# Patient Record
Sex: Male | Born: 2000
Health system: Southern US, Community
[De-identification: ages and names within clinical notes are randomized; demographics above are authoritative.]

## PROBLEM LIST (undated history)

## (undated) DIAGNOSIS — K219 Gastro-esophageal reflux disease without esophagitis: Secondary | ICD-10-CM

## (undated) DIAGNOSIS — F988 Other specified behavioral and emotional disorders with onset usually occurring in childhood and adolescence: Secondary | ICD-10-CM

## (undated) DIAGNOSIS — F32A Depression, unspecified: Secondary | ICD-10-CM

## (undated) DIAGNOSIS — F329 Major depressive disorder, single episode, unspecified: Secondary | ICD-10-CM

## (undated) DIAGNOSIS — F419 Anxiety disorder, unspecified: Secondary | ICD-10-CM

---
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
## 2021-08-03 ENCOUNTER — Encounter: Payer: Self-pay | Admitting: Physical Therapy

## 2021-08-03 ENCOUNTER — Ambulatory Visit: Payer: BC Managed Care – PPO | Attending: Orthopedic Surgery | Admitting: Physical Therapy

## 2021-08-03 DIAGNOSIS — M6281 Muscle weakness (generalized): Secondary | ICD-10-CM | POA: Diagnosis present

## 2021-08-03 DIAGNOSIS — M25611 Stiffness of right shoulder, not elsewhere classified: Secondary | ICD-10-CM | POA: Diagnosis present

## 2021-08-03 DIAGNOSIS — M25511 Pain in right shoulder: Secondary | ICD-10-CM | POA: Insufficient documentation

## 2021-08-03 NOTE — Therapy (Signed)
OUTPATIENT PHYSICAL THERAPY SHOULDER EVALUATION   Patient Name: Adam Hutchinson MRN: [id number] DOB:June 25, 2000, 21 y.o., male Today's Date: 08/03/2021   PT End of Session - 08/03/21 1616     Visit Number 1    Date for PT Re-Evaluation 11/03/21    Authorization Type BCBS    PT Start Time 1613    PT Stop Time 1700    PT Time Calculation (min) 47 min    Activity Tolerance Patient tolerated treatment well    Behavior During Therapy WFL for tasks assessed/performed             Past Medical History:  Diagnosis Date   ADD (attention deficit disorder)    Anxiety    Depression    GERD (gastroesophageal reflux disease)    History reviewed. No pertinent surgical history. Patient Active Problem List   Diagnosis Date Noted   Puberty delay 07/11/2017   Neutropenia ([place]) 07/11/2017   Elevated liver function tests 07/11/2017   Overweight 07/11/2017   Goiter 07/11/2017   Bipolar I disorder ([place]) 06/15/2013   Bipolar affective disorder, mixed, severe ([place]) 12/28/2011   ADHD (attention deficit hyperactivity disorder), combined type 12/28/2011   GAD (generalized anxiety disorder) 12/28/2011   ODD (oppositional defiant disorder) 11/19/2011   Aggressive behavior 11/19/2011   Auditory processing disorder 11/19/2011   Hyperacusis 11/19/2011   GERD (gastroesophageal reflux disease) 11/19/2011    PCP: Gearldine Bienenstock  REFERRING PROVIDER: Sherre Poot  REFERRING DIAG: S/P SLAP repair  THERAPY DIAG:  Acute pain of right shoulder  Stiffness of right shoulder, not elsewhere classified  Muscle weakness (generalized)  Rationale for Evaluation and Treatment Rehabilitation  ONSET DATE: 05/25/21  SUBJECTIVE:                                                                                                                                                                                      SUBJECTIVE STATEMENT: Patient had right SLAP repair 05/25/21, he has been going to PT at [place] where he goes  to school.  He is on break and is wanting to continue PT her in his hometown.  His last PT session was 2 weeks, building up in the protocol and getting a little sore  PERTINENT HISTORY: Pt is a 21 yo male who sustained a R [place] joint dislocation after falling out of a loft bed 09/13/2020. Went to urgent care and was x-ray diagnosed with a dislocation. Was referred to ER and reduced at the ED. Was discharged and followed up with [facility]. Was instructed to monitor symptoms and to follow-up if he had future problems. He again dislocated on 01/19/21 and went to the ED at  [facility]. Dislocation was reduced and referred back to [facility]. Saw Dr. Nicoletta Dress and had MRI which confirmed R SLAP lesion, Hill-sachs deformity. He has been scheduled for surgery on May 8th   PAIN:  Are you having pain? 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, has access to gym  PLOF: Independent was exercising with eights in gym about twice a wekk  PATIENT GOALS have normal ROM and strength  OBJECTIVE:  PATIENT SURVEYS:  FOTO 56  POSTURE: Significantly rounded shoulders some scapular winging  UPPER EXTREMITY ROM:   Active PROM ROM Right AROM eval Right PROM eval  Shoulder flexion 155 170  Shoulder extension    Shoulder abduction 165 175  Shoulder adduction    Shoulder internal rotation 45 50  Shoulder external rotation 90 90  Elbow flexion    Elbow extension    Wrist flexion    Wrist extension    Wrist ulnar deviation    Wrist  radial deviation    Wrist pronation    Wrist supination    (Blank rows = not tested)  UPPER EXTREMITY MMT:  MMT Right eval Left eval  Shoulder flexion 4   Shoulder extension 4+   Shoulder abduction 4-   Shoulder adduction    Shoulder internal rotation 4   Shoulder external rotation 4-   Middle trapezius    Lower trapezius    Elbow flexion    Elbow extension    Wrist flexion    Wrist extension    Wrist ulnar deviation    Wrist radial deviation    Wrist pronation    Wrist supination    Grip strength (lbs)    (Blank rows = not tested) PALPATION:  Not sore, non tender, has scapular winging   TODAY'S TREATMENT:  UBE Level 4 x 6 minutes (7fwd/3bkwd) Rows 15# working on form and posture and scapular retraction 3x10 Lats 15# slow and controlled verbal and tactile cues for form and the scapular retraction    PATIENT EDUCATION: Education details: continue the one from [employer] Person educated: Patient and Parent Education method: Explanation Education comprehension: verbalized understanding   HOME EXERCISE PROGRAM:   ASSESSMENT:  CLINICAL IMPRESSION: Patient is a 21 y.o. male who was seen today for physical therapy evaluation and treatment for PT s/p SLAP repair 5/8/23He was having PT at [facility] but is back in [place] for the summer.  He has good ROM and good strength, some limitations at end range.  Biggest issue is his posture, severe rounded shoulders, very weak periscapular mms.  We have protocol in our accordian file alphabetized.    OBJECTIVE IMPAIRMENTS decreased endurance, decreased ROM, decreased strength, increased muscle spasms, impaired UE functional use, improper body mechanics, postural dysfunction, and pain.   REHAB POTENTIAL: Good  CLINICAL DECISION MAKING: Stable/uncomplicated  EVALUATION COMPLEXITY: Low   GOALS: Goals reviewed with patient? Yes  SHORT TERM GOALS: Target date: 08/17/21  Independent with HEP Goal status: MET  LONG TERM GOALS:  Target date:10/26/21 Hold proper posture for > 5 minutes Goal status: IN PROGRESS  2.  Understand safety in  gym and advanced HEP Goal status: INITIAL  3.  Increase AROM to WNL's Goal status: IN PROGRESS  4.  Increase strength to 5/5 Goal status: IN PROGRESS   PLAN: PT FREQUENCY: 1-2x/week  PT DURATION: 10 weeks  PLANNED INTERVENTIONS: Therapeutic exercises, Therapeutic activity, Neuromuscular re-education, Balance training, Gait training, Patient/Family education, Self Care, Joint mobilization, Vasopneumatic device, Manual therapy, and Re-evaluation  PLAN FOR NEXT SESSION: follow protocol   Sumner Boast, PT 08/03/2021, 4:17 PM

## 2021-08-06 ENCOUNTER — Ambulatory Visit: Payer: BC Managed Care – PPO | Admitting: Physical Therapy

## 2021-08-06 NOTE — Addendum Note (Signed)
Addended by: Jearld Lesch on: 08/06/2021 10:26 AM   Modules accepted: Orders

## 2021-08-10 ENCOUNTER — Encounter: Payer: Self-pay | Admitting: Physical Therapy

## 2021-08-10 ENCOUNTER — Ambulatory Visit: Payer: BC Managed Care – PPO | Admitting: Physical Therapy

## 2021-08-10 DIAGNOSIS — M25511 Pain in right shoulder: Secondary | ICD-10-CM | POA: Diagnosis not present

## 2021-08-10 DIAGNOSIS — M25611 Stiffness of right shoulder, not elsewhere classified: Secondary | ICD-10-CM

## 2021-08-10 DIAGNOSIS — M6281 Muscle weakness (generalized): Secondary | ICD-10-CM

## 2021-08-10 NOTE — Therapy (Signed)
OUTPATIENT PHYSICAL THERAPY SHOULDER EVALUATION   Patient Name: Adam Hutchinson MRN: [id number] DOB:[birth year], 21 y.o., male Today's Date: 08/10/2021   PT End of Session - 08/10/21 1523     Visit Number 2    Date for PT Re-Evaluation 11/03/21    Authorization Type BCBS    PT Start Time 1524    PT Stop Time 1612    PT Time Calculation (min) 48 min    Activity Tolerance Patient tolerated treatment well    Behavior During Therapy WFL for tasks assessed/performed             Past Medical History:  Diagnosis Date   ADD (attention deficit disorder)    Anxiety    Depression    GERD (gastroesophageal reflux disease)    History reviewed. No pertinent surgical history. Patient Active Problem List   Diagnosis Date Noted   Puberty delay 07/11/2017   Neutropenia ([place]) 07/11/2017   Elevated liver function tests 07/11/2017   Overweight 07/11/2017   Goiter 07/11/2017   Bipolar I disorder ([place]) 06/15/2013   Bipolar affective disorder, mixed, severe ([place]) 12/28/2011   ADHD (attention deficit hyperactivity disorder), combined type 12/28/2011   GAD (generalized anxiety disorder) 12/28/2011   ODD (oppositional defiant disorder) 11/19/2011   Aggressive behavior 11/19/2011   Auditory processing disorder 11/19/2011   Hyperacusis 11/19/2011   GERD (gastroesophageal reflux disease) 11/19/2011    PCP: Gearldine Bienenstock  REFERRING PROVIDER: Sherre Poot  REFERRING DIAG: S/P SLAP repair  THERAPY DIAG:  Acute pain of right shoulder  Stiffness of right shoulder, not elsewhere classified  Muscle weakness (generalized)  Rationale for Evaluation and Treatment Rehabilitation  ONSET DATE: 05/25/21  SUBJECTIVE:                                                                                                                                                                                      SUBJECTIVE STATEMENT: Patient had right SLAP repair 05/25/21, he has been going to PT at [place] where he goes  to school.  He is on break and is wanting to continue PT her in his hometown.  He reports no soreness  PERTINENT HISTORY: Pt is a 21 yo male who sustained a R [place] joint dislocation after falling out of a loft bed 09/13/2020. Went to urgent care and was x-ray diagnosed with a dislocation. Was referred to ER and reduced at the ED. Was discharged and followed up with [facility]. Was instructed to monitor symptoms and to follow-up if he had future problems. He again dislocated on 01/19/21 and went to the ED at [facility]. Dislocation was reduced and referred back to [facility] Saw Dr. Nicoletta Dress  and had MRI which confirmed R SLAP lesion, Hill-sachs deformity. He has been scheduled for surgery on May 8th   PAIN:  Are you having pain? 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, has access to gym  PLOF: Independent was exercising with eights in gym about twice a wekk  PATIENT GOALS have normal ROM and strength  OBJECTIVE:  PATIENT SURVEYS:  FOTO 56  POSTURE: Significantly rounded shoulders some scapular winging  UPPER EXTREMITY ROM:   Active PROM ROM Right AROM eval Right PROM eval  Shoulder flexion 155 170  Shoulder extension    Shoulder abduction 165 175  Shoulder adduction    Shoulder internal rotation 45 50  Shoulder external rotation 90 90  Elbow flexion    Elbow extension    Wrist flexion    Wrist extension    Wrist ulnar deviation    Wrist radial deviation    Wrist pronation    Wrist supination    (Blank  rows = not tested)  UPPER EXTREMITY MMT:  MMT Right eval Left eval  Shoulder flexion 4   Shoulder extension 4+   Shoulder abduction 4-   Shoulder adduction    Shoulder internal rotation 4   Shoulder external rotation 4-   Middle trapezius    Lower trapezius    Elbow flexion    Elbow extension    Wrist flexion    Wrist extension    Wrist ulnar deviation    Wrist radial deviation    Wrist pronation    Wrist supination    Grip strength (lbs)    (Blank rows = not tested) PALPATION:  Not sore, non tender, has scapular winging   TODAY'S TREATMENT:  UBE Level 5 x 6 minute  Seated rwo 35# 2x12 Lats 35# 2x12 15# serratus press 2x12 needed cues to get the form 35# triceps  2x12 Biceps 10# 2x12 green tband ER, horizontal abduction, abduction with band behind back, 45degrees Green tband IR, and half star with right arm 8# overhead carry 5# bent over row, 3# extension, 2# reverse flies Light ball thowing, 1# ball throwing 4.4# rhythmic stabilization, both hands and with the right only 4.4# ball around waist for IR    PATIENT EDUCATION: Education details: continue the one from [place] Person educated: Patient and Parent Education method: Explanation Education comprehension: verbalized understanding   HOME EXERCISE PROGRAM:   ASSESSMENT:  CLINICAL IMPRESSION: I added a lot of exercises and really tried to not cause popping or pain, he had one time that the shoulder popped with throwing but no pain and no issue after that with throwing or other exercises.Tolerated all without issues, some cues needed for posture and form   OBJECTIVE IMPAIRMENTS decreased endurance, decreased ROM, decreased strength, increased muscle spasms, impaired UE functional use, improper body mechanics, postural dysfunction, and pain.   REHAB POTENTIAL: Good  CLINICAL DECISION MAKING: Stable/uncomplicated  EVALUATION COMPLEXITY: Low   GOALS: Goals reviewed with patient? Yes  SHORT TERM  GOALS: Target date: 08/17/21  Independent with HEP Goal status: MET  LONG TERM GOALS: Target date:10/26/21 Hold  proper posture for > 5 minutes Goal status: IN PROGRESS  2.  Understand safety in gym and advanced HEP Goal status: INITIAL  3.  Increase AROM to WNL's Goal status: IN PROGRESS  4.  Increase strength to 5/5 Goal status: IN PROGRESS   PLAN: PT FREQUENCY: 1-2x/week  PT DURATION: 10 weeks  PLANNED INTERVENTIONS: Therapeutic exercises, Therapeutic activity, Neuromuscular re-education, Balance training, Gait training, Patient/Family education, Self Care, Joint mobilization, Vasopneumatic device, Manual therapy, and Re-evaluation  PLAN FOR NEXT SESSION: follow protocol continue to add as tolerated    Sumner Boast, PT 08/10/2021, 3:31 PM

## 2021-08-13 ENCOUNTER — Ambulatory Visit: Payer: BC Managed Care – PPO | Admitting: Physical Therapy

## 2021-08-13 ENCOUNTER — Encounter: Payer: Self-pay | Admitting: Physical Therapy

## 2021-08-13 DIAGNOSIS — M25511 Pain in right shoulder: Secondary | ICD-10-CM | POA: Diagnosis not present

## 2021-08-13 DIAGNOSIS — M6281 Muscle weakness (generalized): Secondary | ICD-10-CM

## 2021-08-13 DIAGNOSIS — M25611 Stiffness of right shoulder, not elsewhere classified: Secondary | ICD-10-CM

## 2021-08-13 NOTE — Therapy (Signed)
OUTPATIENT PHYSICAL THERAPY SHOULDER EVALUATION   Patient Name: Adam Hutchinson MRN: [id number] DOB:[birth year], 21 y.o., male Today's Date: 08/13/2021   PT End of Session - 08/13/21 1611     Visit Number 3    Date for PT Re-Evaluation 11/03/21    Authorization Type BCBS    PT Start Time 1610    PT Stop Time 1655    PT Time Calculation (min) 45 min    Activity Tolerance Patient tolerated treatment well    Behavior During Therapy WFL for tasks assessed/performed             Past Medical History:  Diagnosis Date   ADD (attention deficit disorder)    Anxiety    Depression    GERD (gastroesophageal reflux disease)    History reviewed. No pertinent surgical history. Patient Active Problem List   Diagnosis Date Noted   Puberty delay 07/11/2017   Neutropenia ([place]) 07/11/2017   Elevated liver function tests 07/11/2017   Overweight 07/11/2017   Goiter 07/11/2017   Bipolar I disorder ([place]) 06/15/2013   Bipolar affective disorder, mixed, severe ([place]) 12/28/2011   ADHD (attention deficit hyperactivity disorder), combined type 12/28/2011   GAD (generalized anxiety disorder) 12/28/2011   ODD (oppositional defiant disorder) 11/19/2011   Aggressive behavior 11/19/2011   Auditory processing disorder 11/19/2011   Hyperacusis 11/19/2011   GERD (gastroesophageal reflux disease) 11/19/2011    PCP: Gearldine Bienenstock  REFERRING PROVIDER: Sherre Poot  REFERRING DIAG: S/P SLAP repair  THERAPY DIAG:  Acute pain of right shoulder  Stiffness of right shoulder, not elsewhere classified  Muscle weakness (generalized)  Rationale for Evaluation and Treatment Rehabilitation  ONSET DATE: 05/25/21  SUBJECTIVE:                                                                                                                                                                                      SUBJECTIVE STATEMENT: PAtient reports that he was a little sore in the right shoulder after the last treatment,  he thought it may have been the overhead activity, reports soreness for about 24 hours PERTINENT HISTORY: Pt is a 21 yo male who sustained a R [place] joint dislocation after falling out of a loft bed 09/13/2020. Went to urgent care and was x-ray diagnosed with a dislocation. Was referred to ER and reduced at the ED. Was discharged and followed up with [facility]. Was instructed to monitor symptoms and to follow-up if he had future problems. He again dislocated on 01/19/21 and went to the ED at [facility]. Dislocation was reduced and referred back to [facility]. Saw Dr. Nicoletta Dress and had MRI which confirmed R SLAP lesion, Hill-sachs deformity.  He has been scheduled for surgery on May 8th   PAIN:  Are you having pain? 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, has access to gym  PLOF: Independent was exercising with eights in gym about twice a wekk  PATIENT GOALS have normal ROM and strength  OBJECTIVE:  PATIENT SURVEYS:  FOTO 56  POSTURE: Significantly rounded shoulders some scapular winging  UPPER EXTREMITY ROM:   Active PROM ROM Right AROM eval Right PROM eval  Shoulder flexion 155 170  Shoulder extension    Shoulder abduction 165 175  Shoulder adduction    Shoulder internal rotation 45 50  Shoulder external rotation 90 90  Elbow flexion    Elbow extension    Wrist flexion    Wrist extension    Wrist ulnar deviation    Wrist radial deviation    Wrist pronation    Wrist supination    (Blank rows = not  tested)  UPPER EXTREMITY MMT:  MMT Right eval Left eval  Shoulder flexion 4   Shoulder extension 4+   Shoulder abduction 4-   Shoulder adduction    Shoulder internal rotation 4   Shoulder external rotation 4-   Middle trapezius    Lower trapezius    Elbow flexion    Elbow extension    Wrist flexion    Wrist extension    Wrist ulnar deviation    Wrist radial deviation    Wrist pronation    Wrist supination    Grip strength (lbs)    (Blank rows = not tested) PALPATION:  Not sore, non tender, has scapular winging   TODAY'S TREATMENT:  UBE Level 4 x 6 minute  Seated rwo 35# 2x12 Lats 35# 2x12 15# serratus press 2x12 needed cues to get the form 10# bench press with limited ROM 35# triceps  2x12 Biceps 10# 2x12 Red tband ER, horizontal abduction, abduction with band behind back, 45degrees Red tband IR, and half star with right arm Light ball thowing, 1# ball throwing 4.4# rhythmic stabilization, both hands and with the right only 4.4# ball around waist for IR 10# straight arm pulls 20# farmer carry at side PNF 1# W Teacher, English as a foreign language vs wall 4 points   PATIENT EDUCATION: Education details: continue the one from [place] Person educated: Patient and Parent Education method: Explanation Education comprehension: verbalized understanding   HOME EXERCISE PROGRAM:   ASSESSMENT:  CLINICAL IMPRESSION: Patient had some soreness after the last time, I added a few things took away the overhead carry and took away the bent over rows as he thought this was the cause of some of the soreness.  He is doing well, had difficulty with the W backs   OBJECTIVE IMPAIRMENTS decreased endurance, decreased ROM, decreased strength, increased muscle spasms, impaired UE functional use, improper body mechanics, postural dysfunction, and pain.   REHAB POTENTIAL: Good  CLINICAL DECISION MAKING: Stable/uncomplicated  EVALUATION COMPLEXITY: Low   GOALS: Goals reviewed with patient?  Yes  SHORT TERM GOALS: Target date: 08/17/21  Independent with HEP Goal status: MET  LONG TERM GOALS: Target date:10/26/21  Hold proper posture for > 5 minutes Goal status: IN PROGRESS  2.  Understand safety in gym and advanced HEP Goal status: INITIAL  3.  Increase AROM to WNL's Goal status: IN PROGRESS  4.  Increase strength to 5/5 Goal status: IN PROGRESS   PLAN: PT FREQUENCY: 1-2x/week  PT DURATION: 10 weeks  PLANNED INTERVENTIONS: Therapeutic exercises, Therapeutic activity, Neuromuscular re-education, Balance training, Gait training, Patient/Family education, Self Care, Joint mobilization, Vasopneumatic device, Manual therapy, and Re-evaluation  PLAN FOR NEXT SESSION: follow protocol continue to add as tolerated    Sumner Boast, PT 08/13/2021, 4:11 PM

## 2021-08-17 ENCOUNTER — Ambulatory Visit: Payer: BC Managed Care – PPO | Admitting: Physical Therapy

## 2021-08-17 ENCOUNTER — Encounter: Payer: Self-pay | Admitting: Physical Therapy

## 2021-08-17 DIAGNOSIS — M25611 Stiffness of right shoulder, not elsewhere classified: Secondary | ICD-10-CM

## 2021-08-17 DIAGNOSIS — M25511 Pain in right shoulder: Secondary | ICD-10-CM | POA: Diagnosis not present

## 2021-08-17 DIAGNOSIS — M6281 Muscle weakness (generalized): Secondary | ICD-10-CM

## 2021-08-17 NOTE — Therapy (Signed)
OUTPATIENT PHYSICAL THERAPY SHOULDER EVALUATION   Patient Name: Adam Hutchinson MRN: [id number] DOB:Nov 15, 2000, 21 y.o., male Today's Date: 08/17/2021   PT End of Session - 08/17/21 1531     Visit Number 4    Date for PT Re-Evaluation 11/03/21    Authorization Type BCBS    PT Start Time 1527    PT Stop Time 1610    PT Time Calculation (min) 43 min    Activity Tolerance Patient tolerated treatment well    Behavior During Therapy WFL for tasks assessed/performed             Past Medical History:  Diagnosis Date   ADD (attention deficit disorder)    Anxiety    Depression    GERD (gastroesophageal reflux disease)    History reviewed. No pertinent surgical history. Patient Active Problem List   Diagnosis Date Noted   Puberty delay 07/11/2017   Neutropenia ([place]) 07/11/2017   Elevated liver function tests 07/11/2017   Overweight 07/11/2017   Goiter 07/11/2017   Bipolar I disorder ([place]) 06/15/2013   Bipolar affective disorder, mixed, severe ([place]) 12/28/2011   ADHD (attention deficit hyperactivity disorder), combined type 12/28/2011   GAD (generalized anxiety disorder) 12/28/2011   ODD (oppositional defiant disorder) 11/19/2011   Aggressive behavior 11/19/2011   Auditory processing disorder 11/19/2011   Hyperacusis 11/19/2011   GERD (gastroesophageal reflux disease) 11/19/2011    PCP: Gearldine Bienenstock  REFERRING PROVIDER: Sherre Poot  REFERRING DIAG: S/P SLAP repair  THERAPY DIAG:  Acute pain of right shoulder  Stiffness of right shoulder, not elsewhere classified  Muscle weakness (generalized)  Rationale for Evaluation and Treatment Rehabilitation  ONSET DATE: 05/25/21  SUBJECTIVE:                                                                                                                                                                                      SUBJECTIVE STATEMENT: No soreness right now, and did very well after the last treatment without any pain or  soreness PERTINENT HISTORY: Pt is a 21 yo male who sustained a R [place] joint dislocation after falling out of a loft bed 09/13/2020. Went to urgent care and was x-ray diagnosed with a dislocation. Was referred to ER and reduced at the ED. Was discharged and followed up with [facility]. Was instructed to monitor symptoms and to follow-up if he had future problems. He again dislocated on 01/19/21 and went to the ED at [facility]. Dislocation was reduced and referred back to [facility]. Saw Dr. Nicoletta Dress and had MRI which confirmed R SLAP lesion, Hill-sachs deformity. He has been scheduled for surgery on May 8th   PAIN:  Are  you having pain? 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, has access to gym  PLOF: Independent was exercising with eights in gym about twice a wekk  PATIENT GOALS have normal ROM and strength  OBJECTIVE:  PATIENT SURVEYS:  FOTO 56  POSTURE: Significantly rounded shoulders some scapular winging  UPPER EXTREMITY ROM:   Active PROM ROM Right AROM eval Right PROM eval Right  AROM 08/17/21  Shoulder flexion 155 170 174  Shoulder extension     Shoulder abduction 165 175 180  Shoulder adduction     Shoulder internal rotation 45 50 80  Shoulder external rotation 90 90 90  Elbow flexion     Elbow extension     Wrist flexion     Wrist extension     Wrist ulnar deviation     Wrist radial deviation     Wrist pronation     Wrist supination     (Blank rows = not tested)  UPPER EXTREMITY  MMT:  MMT Right eval Left eval  Shoulder flexion 4 4+  Shoulder extension 4+   Shoulder abduction 4- 4  Shoulder adduction    Shoulder internal rotation 4 4+  Shoulder external rotation 4- 4  Middle trapezius    Lower trapezius    Elbow flexion    Elbow extension    Wrist flexion    Wrist extension    Wrist ulnar deviation    Wrist radial deviation    Wrist pronation    Wrist supination    Grip strength (lbs)    (Blank rows = not tested) PALPATION:  Not sore, non tender, has scapular winging   TODAY'S TREATMENT:  08/17/21 UBE level 4 x 6 minutes Wall slides for ROM W backs Wall pushups Weighted ball overhead carry Quadraped weight shifts multiple ways Fitter 1 black band side to side and push pull for scapular stability and closed chain activity 5# bent over row, 3# bent over extension, 2# reverse flies Nustep level 5 x 6 minutes  08/13/21 UBE Level 4 x 6 minute  Seated rwo 35# 2x12 Lats 35# 2x12 15# serratus press 2x12 needed cues to get the form 10# bench press with limited ROM 35# triceps  2x12 Biceps 10# 2x12 Red tband ER, horizontal abduction, abduction with band behind back, 45degrees Red tband IR, and half star with right arm Light ball thowing, 1# ball throwing 4.4# rhythmic stabilization, both hands and with the right only 4.4# ball around waist for IR 10# straight arm pulls 20# farmer carry at side PNF 1# W Teacher, English as a foreign language vs wall 4 points   PATIENT EDUCATION: Education details: continue the one from [place] educated: Patient and Parent Education method: Explanation Education comprehension: verbalized understanding   HOME EXERCISE PROGRAM:   ASSESSMENT:  CLINICAL IMPRESSION: Patient is doing great, ROM is very close to WNL's, His strength is much improved, We are still following the protocol and adding as tolerated , mild soreness at times but does not last > 24 hours.  Still working on the scapular stability and posture, he can  correct  OBJECTIVE IMPAIRMENTS decreased endurance, decreased ROM, decreased strength, increased muscle spasms, impaired UE functional use, improper body mechanics, postural dysfunction, and pain.   REHAB POTENTIAL: Good  CLINICAL DECISION MAKING: Stable/uncomplicated  EVALUATION COMPLEXITY: Low   GOALS: Goals reviewed with patient? Yes  SHORT TERM GOALS: Target date: 08/17/21  Independent with HEP Goal status: MET  LONG TERM GOALS: Target date:10/26/21 Hold proper posture for > 5 minutes Goal status: IN PROGRESS  2.  Understand safety in gym and advanced HEP Goal status: INITIAL  3.  Increase AROM to WNL's Goal status: IN PROGRESS  4.  Increase strength to 5/5 Goal status: IN PROGRESS   PLAN: PT FREQUENCY: 1-2x/week  PT DURATION: 10 weeks  PLANNED INTERVENTIONS: Therapeutic exercises, Therapeutic activity, Neuromuscular re-education, Balance training, Gait training, Patient/Family education, Self Care, Joint mobilization, Vasopneumatic device, Manual therapy, and Re-evaluation  PLAN FOR NEXT SESSION: He will see the MD, we have been following protocol and trying to add to the plan    Sumner Boast, PT 08/17/2021, 3:31 PM

## 2021-08-20 ENCOUNTER — Encounter: Payer: Self-pay | Admitting: Physical Therapy

## 2021-08-20 ENCOUNTER — Ambulatory Visit: Payer: BC Managed Care – PPO | Attending: Orthopedic Surgery | Admitting: Physical Therapy

## 2021-08-20 DIAGNOSIS — M25511 Pain in right shoulder: Secondary | ICD-10-CM | POA: Diagnosis present

## 2021-08-20 DIAGNOSIS — M25611 Stiffness of right shoulder, not elsewhere classified: Secondary | ICD-10-CM | POA: Diagnosis present

## 2021-08-20 DIAGNOSIS — M6281 Muscle weakness (generalized): Secondary | ICD-10-CM | POA: Insufficient documentation

## 2021-08-20 NOTE — Therapy (Signed)
OUTPATIENT PHYSICAL THERAPY SHOULDER EVALUATION   Patient Name: Adam Hutchinson MRN: [id number] DOB:September 19, 2000, 21 y.o., male Today's Date: 08/20/2021   PT End of Session - 08/20/21 1615     Visit Number 5    Date for PT Re-Evaluation 11/03/21    Authorization Type BCBS    PT Start Time 1611    PT Stop Time 1700    PT Time Calculation (min) 49 min    Activity Tolerance Patient tolerated treatment well    Behavior During Therapy WFL for tasks assessed/performed             Past Medical History:  Diagnosis Date   ADD (attention deficit disorder)    Anxiety    Depression    GERD (gastroesophageal reflux disease)    History reviewed. No pertinent surgical history. Patient Active Problem List   Diagnosis Date Noted   Puberty delay 07/11/2017   Neutropenia ([place]) 07/11/2017   Elevated liver function tests 07/11/2017   Overweight 07/11/2017   Goiter 07/11/2017   Bipolar I disorder ([place]) 06/15/2013   Bipolar affective disorder, mixed, severe ([place]) 12/28/2011   ADHD (attention deficit hyperactivity disorder), combined type 12/28/2011   GAD (generalized anxiety disorder) 12/28/2011   ODD (oppositional defiant disorder) 11/19/2011   Aggressive behavior 11/19/2011   Auditory processing disorder 11/19/2011   Hyperacusis 11/19/2011   GERD (gastroesophageal reflux disease) 11/19/2011    PCP: Gearldine Bienenstock  REFERRING PROVIDER: Sherre Poot  REFERRING DIAG: S/P SLAP repair  THERAPY DIAG:  Acute pain of right shoulder  Stiffness of right shoulder, not elsewhere classified  Muscle weakness (generalized)  Rationale for Evaluation and Treatment Rehabilitation  ONSET DATE: 05/25/21  SUBJECTIVE:                                                                                                                                                                                      SUBJECTIVE STATEMENT: Saw MD, very pleased with progress, okay to swim, no basketball, continue to next phase,  needs more strength PERTINENT HISTORY: Pt is a 21 yo male who sustained a R GH joint dislocation after falling out of a loft bed 09/13/2020. Went to urgent care and was x-ray diagnosed with a dislocation. Was referred to ER and reduced at the ED. Was discharged and followed up with [facility]. Was instructed to monitor symptoms and to follow-up if he had future problems. He again dislocated on 01/19/21 and went to the ED at [facility]. Dislocation was reduced and referred back to [facility]. Saw Dr. Nicoletta Dress and had MRI which confirmed R SLAP lesion, Hill-sachs deformity. He has been scheduled for surgery on May 8th   PAIN:  Are you having pain? 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, has access to gym  PLOF: Independent was exercising with eights in gym about twice a wekk  PATIENT GOALS have normal ROM and strength  OBJECTIVE:  PATIENT SURVEYS:  FOTO 56  POSTURE: Significantly rounded shoulders some scapular winging  UPPER EXTREMITY ROM:   Active PROM ROM Right AROM eval Right PROM eval Right  AROM 08/17/21  Shoulder flexion 155 170 174  Shoulder extension     Shoulder abduction 165 175 180  Shoulder adduction     Shoulder internal rotation 45 50 80  Shoulder external rotation 90 90 90  Elbow flexion     Elbow extension     Wrist flexion     Wrist extension     Wrist ulnar deviation     Wrist radial deviation     Wrist pronation     Wrist supination     (Blank rows = not tested)  UPPER EXTREMITY  MMT:  MMT Right eval Left eval  Shoulder flexion 4 4+  Shoulder extension 4+   Shoulder abduction 4- 4  Shoulder adduction    Shoulder internal rotation 4 4+  Shoulder external rotation 4- 4  Middle trapezius    Lower trapezius    Elbow flexion    Elbow extension    Wrist flexion    Wrist extension    Wrist ulnar deviation    Wrist radial deviation    Wrist pronation    Wrist supination    Grip strength (lbs)    (Blank rows = not tested) PALPATION:  Not sore, non tender, has scapular winging   TODAY'S TREATMENT:  08/20/21 Level 5 6 minutes 35# seated rows 2x10 25# lat pulls 2x10 Serratus push 15# Green tband horizontal abduction Red tband PNF W back Wall pushups PROM to the right shoulder focus on the ER Bent over row 5# Bent over extension 3# Reverse flies 3# 3# ER Green tband IR 6# overhead carry Doorway stretch  08/17/21 UBE level 4 x 6 minutes Wall slides for ROM W backs Wall pushups Weighted ball overhead carry Quadraped weight shifts multiple ways Fitter 1 black band side to side and push pull for scapular stability and closed chain activity 5# bent over row, 3# bent over extension, 2# reverse flies Nustep level 5 x 6 minutes  08/13/21 UBE Level 4 x 6 minute  Seated rwo 35# 2x12 Lats 35# 2x12 15# serratus press 2x12 needed cues to get the form 10# bench press with limited ROM 35# triceps  2x12 Biceps 10# 2x12 Red tband ER, horizontal abduction, abduction with band behind back, 45degrees Red tband IR, and half star with right arm Light ball thowing, 1# ball throwing 4.4# rhythmic stabilization, both hands and with the right only 4.4# ball around waist for IR 10# straight arm pulls 20# farmer carry at side PNF 1# W [employer] vs wall 4 points   PATIENT EDUCATION: Education details: continue the one from [place] educated: Patient and Parent Education method: Explanation Education comprehension: verbalized understanding   HOME  EXERCISE PROGRAM:   ASSESSMENT:  CLINICAL IMPRESSION: MD pleased, he can try swimming no basketball, wants [place] to continue to work on strength, I added a few things today, he is a little tight into ER  OBJECTIVE IMPAIRMENTS decreased endurance, decreased ROM, decreased strength, increased muscle spasms, impaired UE functional use, improper body mechanics, postural dysfunction, and pain.   REHAB POTENTIAL: Good  CLINICAL DECISION MAKING: Stable/uncomplicated  EVALUATION COMPLEXITY: Low   GOALS: Goals reviewed with patient? Yes  SHORT TERM GOALS: Target date: 08/17/21  Independent with HEP Goal status: MET  LONG TERM GOALS: Target date:10/26/21 Hold proper posture for > 5 minutes Goal status: IN PROGRESS  2.  Understand safety in gym and advanced HEP Goal status:ongoing  3.  Increase AROM to WNL's Goal status: IN PROGRESS  4.  Increase strength to 5/5 Goal status: IN PROGRESS   PLAN: PT FREQUENCY: 1-2x/week  PT DURATION: 10 weeks  PLANNED INTERVENTIONS: Therapeutic exercises, Therapeutic activity, Neuromuscular re-education, Balance training, Gait training, Patient/Family education, Self Care, Joint mobilization, Vasopneumatic device, Manual therapy, and Re-evaluation  PLAN FOR NEXT SESSION: Continue to add per protocol    Sumner Boast, PT 08/20/2021, 4:16 PM

## 2021-09-01 ENCOUNTER — Ambulatory Visit: Payer: BC Managed Care – PPO | Admitting: Physical Therapy

## 2022-04-26 DIAGNOSIS — K638219 Small intestinal bacterial overgrowth, unspecified: Secondary | ICD-10-CM | POA: Insufficient documentation

## 2022-04-26 DIAGNOSIS — K582 Mixed irritable bowel syndrome: Secondary | ICD-10-CM | POA: Insufficient documentation

## 2022-07-06 DIAGNOSIS — F840 Autistic disorder: Secondary | ICD-10-CM | POA: Insufficient documentation

## 2022-07-06 DIAGNOSIS — D8989 Other specified disorders involving the immune mechanism, not elsewhere classified: Secondary | ICD-10-CM | POA: Insufficient documentation

## 2022-08-28 ENCOUNTER — Other Ambulatory Visit (HOSPITAL_BASED_OUTPATIENT_CLINIC_OR_DEPARTMENT_OTHER): Payer: Self-pay

## 2022-08-28 DIAGNOSIS — R454 Irritability and anger: Secondary | ICD-10-CM

## 2022-08-28 DIAGNOSIS — R5383 Other fatigue: Secondary | ICD-10-CM

## 2023-01-02 ENCOUNTER — Ambulatory Visit (HOSPITAL_BASED_OUTPATIENT_CLINIC_OR_DEPARTMENT_OTHER): Payer: Self-pay | Attending: Pediatrics | Admitting: Internal Medicine

## 2023-01-02 VITALS — Ht 71.0 in | Wt 180.0 lb

## 2023-01-02 DIAGNOSIS — G479 Sleep disorder, unspecified: Secondary | ICD-10-CM | POA: Insufficient documentation

## 2023-01-02 DIAGNOSIS — R5383 Other fatigue: Secondary | ICD-10-CM

## 2023-01-02 DIAGNOSIS — R454 Irritability and anger: Secondary | ICD-10-CM

## 2023-01-09 DIAGNOSIS — R454 Irritability and anger: Secondary | ICD-10-CM

## 2023-01-09 NOTE — Procedures (Signed)
     Patient Name: Adam Hutchinson, Adam Hutchinson Date: 01/02/2023 Gender: Male D.O.B: 02/29/2000 Age (years): 22 Referring Provider: Clydie Braun [place]um Height (inches): 71 Interpreting Physician: Jetty Duhamel MD, ABSM Weight (lbs): 180 RPSGT: Rolene Arbour BMI: 25 MRN: [id number] Neck Size: 15.00  CLINICAL INFORMATION Sleep Study Type: NPSG Indication for sleep study: somnolence Epworth Sleepiness Score: 0  SLEEP STUDY TECHNIQUE As per the AASM Manual for the Scoring of Sleep and Associated Events v2.3 (April 2016) with a hypopnea requiring 4% desaturations.  The channels recorded and monitored were frontal, central and occipital EEG, electrooculogram (EOG), submentalis EMG (chin), nasal and oral airflow, thoracic and abdominal wall motion, anterior tibialis EMG, snore microphone, electrocardiogram, and pulse oximetry.  MEDICATIONS Medications self-administered by patient taken the night of the study : CLONIDINE, MELATONIN, MIRTAZAPINE  SLEEP ARCHITECTURE The study was initiated at 9:49:23 PM and ended at 5:07:03 AM.  Sleep onset time was 26.9 minutes and the sleep efficiency was 91.8%. The total sleep time was 401.8 minutes.  Stage REM latency was 405.5 minutes.  The patient spent 0.4% of the night in stage N1 sleep, 85.1% in stage N2 sleep, 14.4% in stage N3 and 0.1% in REM.  Alpha intrusion was absent.  Supine sleep was 42.68%.  RESPIRATORY PARAMETERS The overall apnea/hypopnea index (AHI) was 0.1 per hour. There were 1 total apneas, including 0 obstructive, 1 central and 0 mixed apneas. There were 0 hypopneas and 0 RERAs.  The AHI during Stage REM sleep was 0.0 per hour.  AHI while supine was 0.0 per hour.  The mean oxygen saturation was 94.2%. The minimum SpO2 during sleep was 90.0%.  soft snoring was noted during this study.  CARDIAC DATA The 2 lead EKG demonstrated sinus rhythm. The mean heart rate was 77.8 beats per minute. Other EKG findings include: None.  LEG  MOVEMENT DATA The total PLMS were 0 with a resulting PLMS index of 0.0. Associated arousal with leg movement index was 0.4 .  IMPRESSIONS - No significant obstructive sleep apnea occurred during this study (AHI = 0.1/h). - No significant central sleep apnea occurred during this study (CAI = 0.1/h). - The patient had minimal or no oxygen desaturation during the study (Min O2 = 90.0%) - The patient snored with soft snoring volume. - No cardiac abnormalities were noted during this study. - Clinically significant periodic limb movements did not occur during sleep. No significant associated arousals. - EEG showed medication effect with mostly stage N2. There were also repeated short bursts of generalized high frequency waves without motor activity, interpreted as somewhat atypical sleep spindles. Seizure activity is less likely, but recording speeds are not optimized for EEG interpretation.  DIAGNOSIS - Other sleep disorder  RECOMMENDATIONS - Consider Neurology evaluation for Sleep EEG if appropriate. - Consider sedating side effects of medications if daytime somnolence is a concern. - Sleep hygiene should be reviewed to assess factors that may improve sleep quality. - Weight management and regular exercise should be initiated or continued if appropriate.  [Electronically signed] 01/09/2023 12:06 PM  Jetty Duhamel MD, ABSM Diplomate, American Board of Sleep Medicine NPI: [id number]                        Jetty Duhamel Diplomate, American Board of Sleep Medicine  ELECTRONICALLY SIGNED ON:  01/09/2023, 11:54 AM [facility] PH: [phone]   FX: [phone] ACCREDITED BY THE AMERICAN ACADEMY OF SLEEP MEDICINE

## 2023-04-06 ENCOUNTER — Ambulatory Visit: Payer: Self-pay | Admitting: Endocrinology

## 2023-04-06 DIAGNOSIS — R7989 Other specified abnormal findings of blood chemistry: Secondary | ICD-10-CM | POA: Diagnosis not present

## 2023-04-06 DIAGNOSIS — B070 Plantar wart: Secondary | ICD-10-CM | POA: Insufficient documentation

## 2023-04-06 DIAGNOSIS — K5909 Other constipation: Secondary | ICD-10-CM | POA: Insufficient documentation

## 2023-04-06 MED ORDER — DEXAMETHASONE 1 MG PO TABS: ORAL_TABLET | ORAL | 0 refills | Status: AC

## 2023-04-06 NOTE — Patient Instructions (Signed)
1 mg dexamethasone overnight suppression test. Instruction: Take 1 mg dexamethasone tablet at 11 PM ( timing is important) and have blood work for cortisol test in the next morning at 8 AM with fasting (timing is important).

## 2023-04-06 NOTE — Progress Notes (Unsigned)
 Outpatient Endocrinology Note Adam Keeghan Mcintire, MD   Patient's Name: Adam Hutchinson    DOB: May 21, 2000    MRN: [id number]  REASON OF VISIT: New consult for elevated cortisol  REFERRING PROVIDER: Denese Killings, MD  PCP: Estrella Myrtle, MD  HISTORY OF PRESENT ILLNESS:   Adam Hutchinson is a 23 y.o. old male with past medical history listed below, is here for new consult of elevated cortisol.  Pertinent Adrenal History: Patient is referred for evaluate for HPA axis dysregulation versus other adrenal disorder, mainly for elevated cortisol.  Medical records scanned into media and also medical records from patient portal on patient's mother's phone reviewed.  Patient was evaluated for sleep problem, reports has difficulty going to sleep.  He has ADHD and ? autism spectrum disorder.  He is accompanied by mother in the clinic today.  With the laboratory evaluation patient had plasma cortisol of 106 with ACTH 5.8 in October 13, 2022.  Reports on the same day patient had IV medication containing glucocorticoid.  Repeat lab work on November 01, 2022 plasma cortisol of 9.0 and ACTH of 25.9.  Patient had salivary cortisol 4 times on November 02, 2022 at different times 9 AM, 5 PM, 9 PM, 11 PM, results were 0.348, 0.093, 0.017, 0.302 respectively.  Patient complains of sleep problem, not able to sleep around midnight.  He wakes up around midmorning.  He has complaints of gradual weight gain, anxiety, depression and fatigue.  He had sleep history reports not diagnosed with sleep apnea.  -No proximal muscle weakness.  No easy bruising.  No facial plethora or significant weight gain.  REVIEW OF SYSTEMS:  As per history of present illness.   PAST MEDICAL HISTORY: Past Medical History:  Diagnosis Date   ADD (attention deficit disorder)    Anxiety    Depression    GERD (gastroesophageal reflux disease)     PAST SURGICAL HISTORY: No past surgical history on file.  ALLERGIES: Allergies  Allergen  Reactions   Lamictal [Lamotrigine]     FAMILY HISTORY:  Family History  Problem Relation Age of Onset   Cancer Maternal Grandmother    Heart disease Paternal Grandmother    Heart disease Paternal Grandfather    GER disease Father        dilatation   GER disease Brother        infantile-outgrown   GER disease Maternal Grandfather        fundoplication   Cholelithiasis Maternal Grandfather    Celiac disease Neg Hx     SOCIAL HISTORY: Social History   Socioeconomic History   Marital status: Single    Spouse name: Not on file   Number of children: Not on file   Years of education: Not on file   Highest education level: Not on file  Occupational History   Not on file  Tobacco Use   Smoking status: Never   Smokeless tobacco: Never  Substance and Sexual Activity   Alcohol use: No   Drug use: No   Sexual activity: Never  Other Topics Concern   Not on file  Social History Narrative   Adam Hutchinson is a 11th grade student.   He attends [employer].   He lives with both parents and his siblings.   He enjoys video games, making games, and school.   Social Drivers of Corporate investment banker Strain: Not on file  Food Insecurity: Not on file  Transportation Needs: Not on file  Physical Activity: Not on  file  Stress: Not on file  Social Connections: Unknown (05/29/2021)   Received from [facility]   Social Network    Social Network: Not on file    MEDICATIONS:  Current Outpatient Medications  Medication Sig Dispense Refill   amLODipine (NORVASC) 2.5 MG tablet Take 2.5 mg by mouth at bedtime.     azithromycin (ZITHROMAX) 500 MG tablet Take 500 mg by mouth daily.     dexamethasone (DECADRON) 1 MG tablet Take 1 tablet by mouth once at 11 pm, before coming for labs at 8 am the next morning 1 tablet 0   Doxepin HCl 6 MG TABS Take 1 tablet by mouth at bedtime.     Multiple Vitamin (MULTI-VITAMIN DAILY) TABS      Probiotic Product (ALIGN KIDS PROBIOTIC  PO)      ABILIFY 15 MG tablet TK 1 T PO QAM  3   amphetamine-dextroamphetamine (ADDERALL XR) 20 MG 24 hr capsule Take 20 mg by mouth every morning.     amphetamine-dextroamphetamine (ADDERALL XR) 25 MG 24 hr capsule Take 25 mg by mouth every morning.     amphetamine-dextroamphetamine (ADDERALL) 10 MG tablet Take 10 mg by mouth daily.     escitalopram (LEXAPRO) 20 MG tablet TK 1 T PO ATN  3   esomeprazole (NEXIUM) 40 MG capsule Take 40 mg by mouth 2 (two) times daily.     MELATONIN ER PO Take 5 mg by mouth at bedtime.      mirtazapine (REMERON) 15 MG tablet Take 15 mg by mouth at bedtime.     modafinil (PROVIGIL) 100 MG tablet Take 100 mg by mouth daily.     Omega-3 Fatty Acids (FISH OIL PO) Take by mouth.     No current facility-administered medications for this visit.    PHYSICAL EXAM: There were no vitals filed for this visit. There is no height or weight on file to calculate BMI.  Wt Readings from Last 3 Encounters:  01/02/23 180 lb (81.6 kg)  10/17/17 135 lb 3.2 oz (61.3 kg) (35%, Z= -0.39)*  08/08/17 133 lb 9.6 oz (60.6 kg) (34%, Z= -0.40)*   * Growth percentiles are based on CDC (Boys, 2-20 Years) data.    General: Well developed, well nourished male in no apparent distress. No cushingoid appearance HEENT: AT/[place], no external lesions. Hearing intact to the spoken word Eyes: EOMI. Conjunctiva clear and no icterus. Neck: Trachea midline, neck supple without appreciable thyromegaly or lymphadenopathy and no palpable thyroid nodules Lungs: Clear to auscultation, no wheeze. Respirations not labored Heart: S1S2, Regular in rate and rhythm. No loud murmurs Abdomen: Soft, non tender, non distended,no striae Neurologic: Alert, oriented, normal speech, deep tendon biceps reflexes normal,  no gross focal neurological deficit Extremities: No pedal pitting edema, no tremors of outstretched hands Skin: Warm, color good. No bruises. Psychiatric: Does not appear depressed or  anxious  PERTINENT HISTORIC LABORATORY AND IMAGING STUDIES:  All pertinent laboratory results were reviewed. Please see HPI for further details.  Lab Results  Component Value Date   CO2 22 01/01/2012   CL 94 (L) 01/01/2012   NA 133 (L) 01/01/2012   GLUCOSE 73 01/01/2012   BUN 18 01/01/2012   No components found for: "CORTRAND", "CORTISOL TOTAL AM", "ALDOSTERONE", "RENIN ACTIVITY", "DEHYDROEPIANDROSTERONE SULFATE", "CATECHOLAMINES FRACTIONATED"     ASSESSMENT / PLAN  1. Elevated cortisol level    -Patient is referred for evaluation of elevated cortisol.  Patient has no features of Cushing syndrome.  Laboratory evaluation  in September had significantly elevated plasma cortisol of 106 with appropriately low ACTH however patient has received IV medication containing glucocorticoid reported by patient on that day.  Repeat plasma cortisol was 9.0 with appropriately normal ACTH.  In October 2024 saliva cortisol was checked however checked at different times of the day on the same day starting from 9 AM to 11 PM.  Patient reported it was checked starting from 9 AM to 9 PM.  There was reported elevated salivary cortisol for the night sample.  Saliva cortisol is supposed to be checked at late-night before going to bed 2-3 samples at different nights.  Patient also has sleep problem going to bed late around midnight and waking up late morning, this may have affected the result as well.  Plan: -I would like to check 1 mg dexamethasone overnight suppression test.  Diagnoses and all orders for this visit:  Elevated cortisol level -     dexamethasone (DECADRON) 1 MG tablet; Take 1 tablet by mouth once at 11 pm, before coming for labs at 8 am the next morning -     Cortisol -     Dexamethasone, blood    DISPOSITION Follow up in clinic in to be determined based on above lab results.  All questions answered and patient verbalized understanding of the plan.  Adam Emira Eubanks, MD [facility]  Endocrinology [facility] [address] [place], [address] [place], [place] [address] Phone # [phone]  At least part of this note was generated using voice recognition software. Inadvertent word errors may have occurred, which were not recognized during the proofreading process.

## 2023-04-07 ENCOUNTER — Encounter: Payer: Self-pay | Admitting: Endocrinology

## 2023-04-12 ENCOUNTER — Other Ambulatory Visit: Payer: Self-pay

## 2023-04-20 ENCOUNTER — Other Ambulatory Visit

## 2023-04-20 ENCOUNTER — Ambulatory Visit: Attending: Pediatrics | Admitting: Physical Therapy

## 2023-04-20 ENCOUNTER — Encounter: Payer: Self-pay | Admitting: Physical Therapy

## 2023-04-20 DIAGNOSIS — M79672 Pain in left foot: Secondary | ICD-10-CM | POA: Diagnosis present

## 2023-04-20 DIAGNOSIS — R262 Difficulty in walking, not elsewhere classified: Secondary | ICD-10-CM | POA: Diagnosis present

## 2023-04-20 DIAGNOSIS — M79671 Pain in right foot: Secondary | ICD-10-CM

## 2023-04-20 NOTE — Therapy (Signed)
 OUTPATIENT PHYSICAL THERAPY LOWER EXTREMITY EVALUATION   Patient Name: Adam Hutchinson MRN: [id number] DOB:2000/04/11, 22 y.o., male Today's Date: 04/20/2023  END OF SESSION:  PT End of Session - 04/20/23 1403     Visit Number 1    Date for PT Re-Evaluation 07/20/23    Authorization Type [employer]    PT Start Time 1400    PT Stop Time 1445    PT Time Calculation (min) 45 min    Activity Tolerance Patient tolerated treatment well    Behavior During Therapy WFL for tasks assessed/performed             Past Medical History:  Diagnosis Date   ADD (attention deficit disorder)    Anxiety    Depression    GERD (gastroesophageal reflux disease)    History reviewed. No pertinent surgical history. 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) 07/06/2022   Irritable bowel syndrome with both constipation and diarrhea 04/26/2022   Small intestinal bacterial overgrowth (SIBO) 04/26/2022   Puberty delay 07/11/2017   Neutropenia (HCC) 07/11/2017   Elevated liver function tests 07/11/2017   Overweight 07/11/2017   Goiter 07/11/2017   Bipolar I disorder (HCC) 06/15/2013   Bipolar affective disorder, mixed, severe (HCC) 12/28/2011   ADHD (attention deficit hyperactivity disorder), combined type 12/28/2011   GAD (generalized anxiety disorder) 12/28/2011   Oppositional defiant disorder 11/19/2011   Aggressive behavior 11/19/2011   Auditory processing disorder 11/19/2011   Hyperacusis 11/19/2011   GERD (gastroesophageal reflux disease) 11/19/2011    PCP: Elsie Saas, MD  REFERRING PROVIDER: Aretta Nip, MD  REFERRING DIAG: encephalitis, pain in feet, difficulty walking  THERAPY DIAG:   Pain in left foot  Pain in right foot  Difficulty in walking, not elsewhere classified  Rationale for Evaluation and Treatment: Rehabilitation  ONSET DATE: 03/31/22  SUBJECTIVE:   SUBJECTIVE STATEMENT: Patient reports that he has been having some foot issues last fall, he was trying to walk to and from class and he would really start to hurt, reports that the foot pain has gotten worse.  He is home now due to the foot pain  PERTINENT HISTORY: ADD, anxiety, depression PAIN:  Are you having pain? Yes: NPRS scale: 0/10 Pain location: plantar surface, arch area bilateral Pain description: shooting, sharp, ache, like stepping on nails Aggravating factors: Walking less than a quarter a mile, stairs pain up to 10/10 Relieving factors: lying down, pain can be 0/10  PRECAUTIONS: None  RED FLAGS: None   WEIGHT BEARING RESTRICTIONS: No  FALLS:  Has patient fallen in last 6 months? No  LIVING ENVIRONMENT: Lives with: lives alone Lives in: House/apartment Stairs: Yes: Internal: 15 steps; can reach both Has following equipment at home: None  OCCUPATION: student  PLOF: Independent and prior to fall basketball, swim, walks alot  PATIENT GOALS: have less pain  NEXT MD VISIT: 4 weeks  OBJECTIVE:  Note: Objective measures were completed at Evaluation unless otherwise noted.  DIAGNOSTIC FINDINGS: none   COGNITION: Overall cognitive status: Within functional limits for tasks assessed     SENSATION: WFL  MUSCLE LENGTH: Very tight calves, tight HS  POSTURE: rounded shoulders, forward head, and increased thoracic kyphosis, has significant over pronation bilaterally at the foot/ankle  PALPATION: Really feels tight  but is non tender in the PF  LOWER EXTREMITY ROM:  Active ROM Right eval Left eval  Hip flexion    Hip extension    Hip abduction    Hip adduction    Hip internal rotation    Hip external rotation    Knee flexion    Knee extension    Ankle  dorsiflexion 0 -5 from neutral  Ankle plantarflexion    Ankle inversion    Ankle eversion     (Blank rows = not tested)  LOWER EXTREMITY MMT: WFL's for ankle  MMT Right eval Left eval  Hip flexion    Hip extension    Hip abduction    Hip adduction    Hip internal rotation    Hip external rotation    Knee flexion    Knee extension    Ankle dorsiflexion    Ankle plantarflexion    Ankle inversion    Ankle eversion     (Blank rows = not tested)  LOWER EXTREMITY SPECIAL TESTS:  With single leg stance really drops the arch and pronates seen more with walking  FUNCTIONAL TESTS:  Timed up and go (TUG): 14 seconds  GAIT: Distance walked: 50 feet Assistive device utilized: None Level of assistance: Complete Independence Comments: over pronation                                                                                                                                TREATMENT DATE:  04/20/23 Evaluation    PATIENT EDUCATION:  Education details: HEP/POC/inserts Person educated: Patient and Parent Education method: Explanation, Demonstration, Actor cues, Verbal cues, and Handouts Education comprehension: verbalized understanding  HOME EXERCISE PROGRAM: Access Code: [facility] URL: [email or web address] Date: 04/20/2023 Prepared by: Stacie Glaze  Exercises - Seated Hamstring Stretch with Chair  - 2 x daily - 7 x weekly - 1 sets - 10 reps - 30 hold - Gastroc Stretch with Foot at Wall  - 2 x daily - 7 x weekly - 1 sets - 10 reps - 30 hold - Standing Bilateral Gastroc Stretch with Step  - 2 x daily - 7 x weekly - 1 sets - 10 reps - 30 hold  ASSESSMENT:  CLINICAL IMPRESSION: Patient is a 23 y.o. male who was seen today for physical therapy evaluation and treatment for bilateral foot pain.  Reports it started in the fall and he does report that there were a few time that he was walking for a mile or two in water shoes.  HE has significant pronation with  walking and standing, he does not have pain in the AM, is not very tender, does have very tight calves and HS, pain is with stairs and with walking a 1/4 mile   OBJECTIVE IMPAIRMENTS: Abnormal gait, cardiopulmonary status limiting activity, decreased activity tolerance, decreased balance, decreased coordination, decreased endurance, decreased mobility, difficulty walking, decreased ROM, decreased strength, increased fascial restrictions, impaired flexibility, improper body mechanics,  postural dysfunction, and pain.   REHAB POTENTIAL: Good  CLINICAL DECISION MAKING: Stable/uncomplicated  EVALUATION COMPLEXITY: Low   GOALS: Goals reviewed with patient? Yes  SHORT TERM GOALS: Target date: 05/20/23 Independent with initial HEP Baseline: Goal status: INITIAL  LONG TERM GOALS: Target date: 07/20/23  Independent with advanced HEP Baseline:  Goal status: INITIAL  2.  Get inserts Baseline:  Goal status: INITIAL  3.  Decrease pain with walking 1 mile 50% Baseline: 10/10 with 1/4 mile Goal status: INITIAL  4.  Increase DF to 6 degrees Baseline:  Goal status: INITIAL  PLAN:  PT FREQUENCY: 1x/week  PT DURATION: 12 weeks  PLANNED INTERVENTIONS: 97110-Therapeutic exercises, 97530- Therapeutic activity, 97112- Neuromuscular re-education, 97535- Self Care, 53664- Manual therapy, [phone]- Gait training, [phone]- Orthotic Fit/training, Patient/Family education, Balance training, Stair training, Taping, Dry Needling, Cryotherapy, and Moist heat  PLAN FOR NEXT SESSION: he will return to school and see how he does with HEP and with orthotics, will return in a few weeks   Montie Swiderski W, PT 04/20/2023, 2:04 PM

## 2023-04-27 ENCOUNTER — Encounter: Payer: Self-pay | Admitting: Endocrinology

## 2023-04-29 ENCOUNTER — Telehealth: Payer: Self-pay

## 2023-04-29 LAB — DEXAMETHASONE, BLOOD: Dexamethasone, Serum: 252 ng/dL

## 2023-04-29 LAB — CORTISOL: Cortisol, Plasma: 0.7 ug/dL — ABNORMAL LOW

## 2023-04-29 NOTE — Telephone Encounter (Signed)
-----   Message from [place] Thapa sent at 04/29/2023  9:58 AM EDT ----- Please notify patient of appropriate level of dexamethasone for the test.  Normal test result with 1 mg dexamethasone suppression test.  No routine endocrinology follow-up is required.  Encouraged to call our clinic with any questions.  Continue to follow-up with other providers.

## 2023-04-29 NOTE — Telephone Encounter (Signed)
 Patient given results  as directed by MD.

## 2023-05-23 ENCOUNTER — Ambulatory Visit: Attending: Pediatrics | Admitting: Physical Therapy

## 2023-05-23 ENCOUNTER — Encounter: Payer: Self-pay | Admitting: Physical Therapy

## 2023-05-23 DIAGNOSIS — R262 Difficulty in walking, not elsewhere classified: Secondary | ICD-10-CM | POA: Insufficient documentation

## 2023-05-23 DIAGNOSIS — M79671 Pain in right foot: Secondary | ICD-10-CM | POA: Diagnosis present

## 2023-05-23 DIAGNOSIS — M79672 Pain in left foot: Secondary | ICD-10-CM | POA: Diagnosis present

## 2023-05-23 NOTE — Therapy (Signed)
 OUTPATIENT PHYSICAL THERAPY LOWER EXTREMITY TREATMENT   Patient Name: Adam Hutchinson MRN: [id number] DOB:22-May-2000, 23 y.o., male Today's Date: 05/23/2023  END OF SESSION:  PT End of Session - 05/23/23 1359     Visit Number 2    Date for PT Re-Evaluation 07/20/23    Authorization Type [employer]    PT Start Time 1358    PT Stop Time 1441    PT Time Calculation (min) 43 min    Activity Tolerance Patient tolerated treatment well    Behavior During Therapy WFL for tasks assessed/performed             Past Medical History:  Diagnosis Date   ADD (attention deficit disorder)    Anxiety    Depression    GERD (gastroesophageal reflux disease)    History reviewed. No pertinent surgical history. 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) 07/06/2022   Irritable bowel syndrome with both constipation and diarrhea 04/26/2022   Small intestinal bacterial overgrowth (SIBO) 04/26/2022   Puberty delay 07/11/2017   Neutropenia (HCC) 07/11/2017   Elevated liver function tests 07/11/2017   Overweight 07/11/2017   Goiter 07/11/2017   Bipolar I disorder (HCC) 06/15/2013   Bipolar affective disorder, mixed, severe (HCC) 12/28/2011   ADHD (attention deficit hyperactivity disorder), combined type 12/28/2011   GAD (generalized anxiety disorder) 12/28/2011   Oppositional defiant disorder 11/19/2011   Aggressive behavior 11/19/2011   Auditory processing disorder 11/19/2011   Hyperacusis 11/19/2011   GERD (gastroesophageal reflux disease) 11/19/2011    PCP: Camie Cease, MD  REFERRING PROVIDER: Hermelinda Loop, MD  REFERRING DIAG: encephalitis, pain in feet, difficulty walking  THERAPY DIAG:   Pain in left foot  Pain in right foot  Difficulty in walking, not elsewhere classified  Rationale for Evaluation and Treatment: Rehabilitation  ONSET DATE: 03/31/22  SUBJECTIVE:   SUBJECTIVE STATEMENT: Patient now at home, still with c/o pain in the feet.  Pain is mostly with walking and pain can be up to 9/10  PERTINENT HISTORY: ADD, anxiety, depression PAIN:  Are you having pain? Yes: NPRS scale: 0/10 Pain location: plantar surface, arch area bilateral Pain description: shooting, sharp, ache, like stepping on nails Aggravating factors: Walking less than a quarter a mile, stairs pain up to 10/10 Relieving factors: lying down, pain can be 0/10  PRECAUTIONS: None  RED FLAGS: None   WEIGHT BEARING RESTRICTIONS: No  FALLS:  Has patient fallen in last 6 months? No  LIVING ENVIRONMENT: Lives with: lives alone Lives in: House/apartment Stairs: Yes: Internal: 15 steps; can reach both Has following equipment at home: None  OCCUPATION: student  PLOF: Independent and prior to fall basketball, swim, walks alot  PATIENT GOALS: have less pain  NEXT MD VISIT: 4 weeks  OBJECTIVE:  Note: Objective measures were completed at Evaluation unless otherwise noted.  DIAGNOSTIC FINDINGS: none   COGNITION: Overall cognitive status: Within functional limits for tasks assessed     SENSATION: WFL  MUSCLE LENGTH: Very tight calves, tight HS  POSTURE: rounded shoulders, forward head, and increased thoracic kyphosis, has significant over pronation bilaterally at the foot/ankle  PALPATION: Really feels tight but is non tender in the PF  LOWER EXTREMITY ROM:  Active ROM Right eval Left eval  Hip flexion  Hip extension    Hip abduction    Hip adduction    Hip internal rotation    Hip external rotation    Knee flexion    Knee extension    Ankle dorsiflexion 0 -5 from neutral  Ankle plantarflexion    Ankle inversion    Ankle eversion     (Blank rows = not  tested)  LOWER EXTREMITY MMT: WFL's for ankle  MMT Right eval Left eval  Hip flexion    Hip extension    Hip abduction    Hip adduction    Hip internal rotation    Hip external rotation    Knee flexion    Knee extension    Ankle dorsiflexion    Ankle plantarflexion    Ankle inversion    Ankle eversion     (Blank rows = not tested)  LOWER EXTREMITY SPECIAL TESTS:  With single leg stance really drops the arch and pronates seen more with walking  FUNCTIONAL TESTS:  Timed up and go (TUG): 14 seconds  GAIT: Distance walked: 50 feet Assistive device utilized: None Level of assistance: Complete Independence Comments: over pronation                                                                                                                                TREATMENT DATE:  05/23/23 Nustep level 5 x 6 minutes Cal stretches Seated sit fit ankle motions all bilaterally 40# calf press, then single legs 2x10 each Towel scrunches with 3# weight bilateral Towel pick ups bilateral Green tband ankle all motions STM to the PR area bilaterally  04/20/23 Evaluation    PATIENT EDUCATION:  Education details: HEP/POC/inserts Person educated: Patient and Parent Education method: Explanation, Demonstration, Actor cues, Verbal cues, and Handouts Education comprehension: verbalized understanding  HOME EXERCISE PROGRAM: Access Code: [facility] URL: [email or web address] Date: 04/20/2023 Prepared by: Cherylene Corrente  Exercises - Seated Hamstring Stretch with Chair  - 2 x daily - 7 x weekly - 1 sets - 10 reps - 30 hold - Gastroc Stretch with Foot at Wall  - 2 x daily - 7 x weekly - 1 sets - 10 reps - 30 hold - Standing Bilateral Gastroc Stretch with Step  - 2 x daily - 7 x weekly - 1 sets - 10 reps - 30 hold  ASSESSMENT:  CLINICAL IMPRESSION: Patient has been at school the psat month, is now finished, he is trying to wear better shoes.  He reports still pain up to  9/10 with walking still.  He was able to do the exercises with me today only had pain with the calf presses.  He did have coordination issues with some ankle motions and the towel scrunches  Patient is a 23 y.o. male who was seen today for physical therapy evaluation and treatment for bilateral foot pain.  Reports it started in the fall and he does report that there were a few time that  he was walking for a mile or two in water shoes.  HE has significant pronation with walking and standing, he does not have pain in the AM, is not very tender, does have very tight calves and HS, pain is with stairs and with walking a 1/4 mile   OBJECTIVE IMPAIRMENTS: Abnormal gait, cardiopulmonary status limiting activity, decreased activity tolerance, decreased balance, decreased coordination, decreased endurance, decreased mobility, difficulty walking, decreased ROM, decreased strength, increased fascial restrictions, impaired flexibility, improper body mechanics, postural dysfunction, and pain.   REHAB POTENTIAL: Good  CLINICAL DECISION MAKING: Stable/uncomplicated  EVALUATION COMPLEXITY: Low   GOALS: Goals reviewed with patient? Yes  SHORT TERM GOALS: Target date: 05/20/23 Independent with initial HEP Baseline: Goal status: met 05/23/23  LONG TERM GOALS: Target date: 07/20/23  Independent with advanced HEP Baseline:  Goal status: INITIAL  2.  Get inserts Baseline:  Goal status: INITIAL  3.  Decrease pain with walking 1 mile 50% Baseline: 10/10 with 1/4 mile Goal status: INITIAL  4.  Increase DF to 6 degrees Baseline:  Goal status: INITIAL  PLAN:  PT FREQUENCY: 1x/week  PT DURATION: 12 weeks  PLANNED INTERVENTIONS: 97110-Therapeutic exercises, 97530- Therapeutic activity, 97112- Neuromuscular re-education, 97535- Self Care, 48546- Manual therapy, [phone]- Gait training, [phone]- Orthotic Fit/training, Patient/Family education, Balance training, Stair training, Taping, Dry Needling, Cryotherapy,  and Moist heat  PLAN FOR NEXT SESSION: will work with him regarding the foot pain   Finnbar Cedillos W, PT 05/23/2023, 1:59 PM

## 2023-06-01 ENCOUNTER — Encounter: Payer: Self-pay | Admitting: Neurology

## 2023-06-02 ENCOUNTER — Other Ambulatory Visit: Payer: Self-pay

## 2023-06-02 DIAGNOSIS — R202 Paresthesia of skin: Secondary | ICD-10-CM

## 2023-06-17 ENCOUNTER — Ambulatory Visit: Admitting: Physical Therapy

## 2023-06-22 ENCOUNTER — Ambulatory Visit: Admitting: Physical Therapy

## 2023-07-08 ENCOUNTER — Encounter: Admitting: Neurology
